# Patient Record
Sex: Male | Born: 2000 | Race: White | Hispanic: No | Marital: Single | Smoking: Current every day smoker
Health system: Southern US, Community
[De-identification: ages and names within clinical notes are randomized; demographics above are authoritative.]

## PROBLEM LIST (undated history)

## (undated) DIAGNOSIS — F1110 Opioid abuse, uncomplicated: Secondary | ICD-10-CM

## (undated) DIAGNOSIS — F909 Attention-deficit hyperactivity disorder, unspecified type: Secondary | ICD-10-CM

## (undated) DIAGNOSIS — F913 Oppositional defiant disorder: Secondary | ICD-10-CM

---
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
## 2019-05-01 ENCOUNTER — Emergency Department (HOSPITAL_BASED_OUTPATIENT_CLINIC_OR_DEPARTMENT_OTHER)
Admission: EM | Admit: 2019-05-01 | Discharge: 2019-05-01 | Disposition: A | Discharge status: Home / Self Care | Payer: BLUE CROSS/BLUE SHIELD | Attending: Emergency Medicine | Admitting: Emergency Medicine

## 2019-05-01 ENCOUNTER — Encounter (HOSPITAL_BASED_OUTPATIENT_CLINIC_OR_DEPARTMENT_OTHER): Payer: Self-pay | Admitting: Emergency Medicine

## 2019-05-01 ENCOUNTER — Other Ambulatory Visit: Payer: Self-pay

## 2019-05-01 DIAGNOSIS — F17210 Nicotine dependence, cigarettes, uncomplicated: Secondary | ICD-10-CM | POA: Diagnosis not present

## 2019-05-01 DIAGNOSIS — M79621 Pain in right upper arm: Secondary | ICD-10-CM | POA: Insufficient documentation

## 2019-05-01 DIAGNOSIS — M79622 Pain in left upper arm: Secondary | ICD-10-CM | POA: Insufficient documentation

## 2019-05-01 DIAGNOSIS — Z880 Allergy status to penicillin: Secondary | ICD-10-CM | POA: Insufficient documentation

## 2019-05-01 DIAGNOSIS — R519 Headache, unspecified: Secondary | ICD-10-CM | POA: Diagnosis not present

## 2019-05-01 DIAGNOSIS — M25562 Pain in left knee: Secondary | ICD-10-CM | POA: Diagnosis not present

## 2019-05-01 DIAGNOSIS — Z79899 Other long term (current) drug therapy: Secondary | ICD-10-CM | POA: Insufficient documentation

## 2019-05-01 HISTORY — DX: Opioid abuse, uncomplicated: F11.10

## 2019-05-01 MED ORDER — CYCLOBENZAPRINE HCL 10 MG PO TABS: 10.0000 mg | ORAL_TABLET | Freq: Two times a day (BID) | ORAL | 0 refills | Status: AC | PRN

## 2019-05-01 MED ORDER — NAPROXEN 500 MG PO TABS: 500.0000 mg | ORAL_TABLET | Freq: Two times a day (BID) | ORAL | 0 refills | Status: AC
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
## 2019-11-04 ENCOUNTER — Encounter (HOSPITAL_BASED_OUTPATIENT_CLINIC_OR_DEPARTMENT_OTHER): Payer: Self-pay | Admitting: Emergency Medicine

## 2019-11-04 ENCOUNTER — Emergency Department (HOSPITAL_BASED_OUTPATIENT_CLINIC_OR_DEPARTMENT_OTHER): Payer: BC Managed Care – PPO

## 2019-11-04 ENCOUNTER — Other Ambulatory Visit: Payer: Self-pay

## 2019-11-04 ENCOUNTER — Emergency Department (HOSPITAL_BASED_OUTPATIENT_CLINIC_OR_DEPARTMENT_OTHER)
Admission: EM | Admit: 2019-11-04 | Discharge: 2019-11-04 | Disposition: A | Discharge status: Home / Self Care | Payer: BC Managed Care – PPO | Attending: Emergency Medicine | Admitting: Emergency Medicine

## 2019-11-04 DIAGNOSIS — S6292XA Unspecified fracture of left wrist and hand, initial encounter for closed fracture: Secondary | ICD-10-CM | POA: Insufficient documentation

## 2019-11-04 DIAGNOSIS — Y9351 Activity, roller skating (inline) and skateboarding: Secondary | ICD-10-CM | POA: Insufficient documentation

## 2019-11-04 DIAGNOSIS — F909 Attention-deficit hyperactivity disorder, unspecified type: Secondary | ICD-10-CM | POA: Insufficient documentation

## 2019-11-04 DIAGNOSIS — Y9289 Other specified places as the place of occurrence of the external cause: Secondary | ICD-10-CM | POA: Diagnosis not present

## 2019-11-04 DIAGNOSIS — F17210 Nicotine dependence, cigarettes, uncomplicated: Secondary | ICD-10-CM | POA: Insufficient documentation

## 2019-11-04 DIAGNOSIS — Y999 Unspecified external cause status: Secondary | ICD-10-CM | POA: Insufficient documentation

## 2019-11-04 DIAGNOSIS — S62102A Fracture of unspecified carpal bone, left wrist, initial encounter for closed fracture: Secondary | ICD-10-CM

## 2019-11-04 DIAGNOSIS — S6992XA Unspecified injury of left wrist, hand and finger(s), initial encounter: Secondary | ICD-10-CM | POA: Diagnosis present

## 2019-11-04 MED ORDER — IBUPROFEN 400 MG PO TABS
600.0000 mg | ORAL_TABLET | Freq: Once | ORAL | Status: AC
  Administered 2019-11-04: 600 mg via ORAL
  Filled 2019-11-04: qty 1
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
## 2019-11-04 NOTE — ED Provider Notes (Signed)
[facility] EMERGENCY DEPARTMENT Provider Note   CSN: [id number] Arrival date & time: 11/04/19  1943     History Chief Complaint  Patient presents with  . Wrist Pain   Lee Trevino is a 19 year old man with past medical history of ADHD and prior opioid abuse who presents for evaluation of left wrist pain.   Patient reports consuming multiple beers, shots of liquor, and margaritas earlier this afternoon. Tonight, while riding a skateboard, he fell onto his outstretched left hand. Immediately after, he noticed swelling to the area and significant pain. He reports that he can still move his hand, and he reports having intact sensation. He has not attempted to take anything for pain, and he states that movement makes the pain worse. He experienced no other injuries and did not hit his head or lose consciousness. Of note, he has a prior history of opioid abuse and wishes to not take narcotic medications for pain.      Past Medical History:  Diagnosis Date  . Attention deficit hyperactivity disorder (ADHD)   . Drug abuse, opioid type ([facility])    " Clean x 1 year"  . ODD (oppositional defiant disorder)    Patient Active Problem List   Diagnosis Date Noted  . Chronic post-traumatic stress disorder (PTSD) 04/05/2015  . ODD (oppositional defiant disorder) 04/03/2015  . DMDD (disruptive mood dysregulation disorder) ([facility]) 04/03/2015  . Marijuana abuse, continuous 04/03/2015  . H/O attention deficit hyperactivity disorder 04/03/2015  . Acne 01/03/2015  . ADD (attention deficit disorder) 06/24/2010  . Cannot sleep 06/24/2010   History reviewed. No pertinent surgical history.    Family History  Problem Relation Age of Onset  . Bipolar disorder Mother    Social History   Tobacco Use  . Smoking status: Current Every Day Smoker    Packs/day: 0.50    Years: 4.00    Pack years: 2.00    Types: Cigarettes  . Smokeless tobacco: Former Engineer, water Use Topics  . Alcohol  use: No  . Drug use: Yes    Types: Marijuana    Comment: Daiy use   Home Medications Prior to Admission medications   Medication Sig Start Date End Date Taking? Authorizing Provider  cyclobenzaprine (FLEXERIL) 10 MG tablet Take 1 tablet (10 mg total) by mouth 2 (two) times daily as needed for muscle spasms. 05/01/19   Mannie Stabile, PA-C  escitalopram (LEXAPRO) 10 MG tablet Reported on 05/10/2015 02/19/15   [provider]  hydrOXYzine (ATARAX/VISTARIL) 10 MG tablet Take 1-2 tablets as needed for anxiety 04/05/15   Court Joy, PA-C  ibuprofen (ADVIL,MOTRIN) 600 MG tablet Take 1 tablet (600 mg total) by mouth every 6 (six) hours as needed. 12/24/14   Antony Madura, PA-C  lamoTRIgine (LAMICTAL) 25 MG tablet Take 25 mg x 5 days then 50 mg x 5 days then 75 mg x 5 days then 100 mg daily as directed 04/11/15   Court Joy, PA-C  lisdexamfetamine (VYVANSE) 30 MG capsule Take 75 mg by mouth daily.     [provider]  lisdexamfetamine (VYVANSE) 30 MG capsule Take by mouth. 04/15/15   [provider]  Melatonin 10 MG CAPS Take by mouth. Reported on 05/10/2015    [provider]  naproxen (NAPROSYN) 500 MG tablet Take 1 tablet (500 mg total) by mouth 2 (two) times daily. 05/01/19   Mannie Stabile, PA-C   Allergies    Amoxicillin and Penicillins  Review of  Systems   Review of Systems  Constitutional: Negative for chills and fever.  HENT: Negative for ear pain and sore throat.   Eyes: Negative for pain and visual disturbance.  Respiratory: Negative for cough and shortness of breath.   Cardiovascular: Negative for chest pain and palpitations.  Gastrointestinal: Negative for abdominal pain and vomiting.  Genitourinary: Negative for dysuria and hematuria.  Musculoskeletal:       Significant pain and swelling of the left wrist.  Skin: Negative for color change and rash.  Neurological: Negative for seizures and syncope.  All other systems reviewed and  are negative.  Physical Exam Updated Vital Signs BP (!) 119/95 (BP Location: Right Arm)   Pulse (!) 123   Temp 98.4 F (36.9 C) (Oral)   Resp 18   Ht 5\' 11"  (1.803 m)   Wt 126.1 kg   SpO2 98%   BMI 38.77 kg/m   Physical Exam Vitals and nursing note reviewed.  Constitutional:      Appearance: He is well-developed.  HENT:     Head: Normocephalic and atraumatic.  Eyes:     Conjunctiva/sclera: Conjunctivae normal.  Cardiovascular:     Rate and Rhythm: Normal rate and regular rhythm.     Pulses: Normal pulses.     Heart sounds: No murmur heard.      Comments: Intact left radial pulse Pulmonary:     Effort: Pulmonary effort is normal. No respiratory distress.     Breath sounds: Normal breath sounds.  Abdominal:     Palpations: Abdomen is soft.     Tenderness: There is no abdominal tenderness.  Musculoskeletal:        General: Swelling, tenderness, deformity and signs of injury present.     Cervical back: Neck supple.     Comments: Significant swelling and tenderness to the dorsal aspect of the left wrist. ROM limited due to pain.  Skin:    General: Skin is warm and dry.     Comments: No lacerations or bruising to the left wrist  Neurological:     General: No focal deficit present.     Mental Status: He is alert and oriented to person, place, and time.     Comments: Sensation of left digits intact  Psychiatric:        Mood and Affect: Mood normal.        Behavior: Behavior normal.    ED Results / Procedures / Treatments   Labs (all labs ordered are listed, but only abnormal results are displayed) Labs Reviewed - No data to display  EKG None  Radiology DG Wrist Complete Left  Result Date: 11/04/2019 CLINICAL DATA:  Injury EXAM: LEFT WRIST - COMPLETE 3+ VIEW COMPARISON:  None. FINDINGS: There is a nondisplaced buckle fracture seen through the distal radius. Intra-articular extension is seen along the dorsal ulnar surface of the distal radius. There also appears to  be a probable nondisplaced fracture seen through the mid scaphoid waist. Diffuse soft tissue swelling seen around the wrist. IMPRESSION: Nondisplaced buckle fracture of the distal radius with intra-articular extension dorsally. Nondisplaced scaphoid waist fracture Electronically Signed   By: 01/04/2020 M.D.   On: 11/04/2019 20:20    Procedures Procedures (including critical care time)  Medications Ordered in ED Medications  ibuprofen (ADVIL) tablet 600 mg (has no administration in time range)    ED Course  I have reviewed the triage vital signs and the nursing notes.  Pertinent labs & imaging results that were available during my  care of the patient were reviewed by me and considered in my medical decision making (see chart for details).    MDM Rules/Calculators/A&P                          Lee Trevino is a 19 year old man with past medical history of ADHD and prior opioid abuse who presents for evaluation of left wrist pain. Patient is hemodynamically stable aside from tachycardia which is likely secondary to pain. Physical examination reveals significant swelling and tenderness to the dorsal aspect of the left wrist. Radiologic findings demonstrate nondisplaced buckle fracture of the distal radius and nondisplaced scaphoid waist fracture. Given these findings, he will require immobilization of the wrist with a thumb spica splint as well as follow-up with Dr. Melvyn Novas. We will give him ibuprofen while in the ED, as we are limited with pain management options in the setting of acute intoxication and history of opioid abuse. Additionally, we will put in a work note indicating that he will not be able to return to work until cleared by specialist.  Final Clinical Impression(s) / ED Diagnoses Final diagnoses:  Closed fracture of left wrist, initial encounter    Rx / DC Orders ED Discharge Orders    None       Jasmine December, MD 11/04/19 2146    Gwyneth Sprout,  MD 11/04/19 2342

## 2019-11-04 NOTE — ED Notes (Signed)
  Patient states he has had several 16 oz beers, 3-4 mixed drinks, and several shots around 1830.    Patient also states that he is a recovering prescription painkiller addict and would not like any narcotics for pain control.  MD notified.

## 2021-10-18 IMAGING — CR DG WRIST COMPLETE 3+V*L*
4 series · 4 of 4 positions shown · non-contrast
Comparison: None.

CLINICAL DATA: Injury

EXAM:
LEFT WRIST - COMPLETE 3+ VIEW

[x wrist pa left]
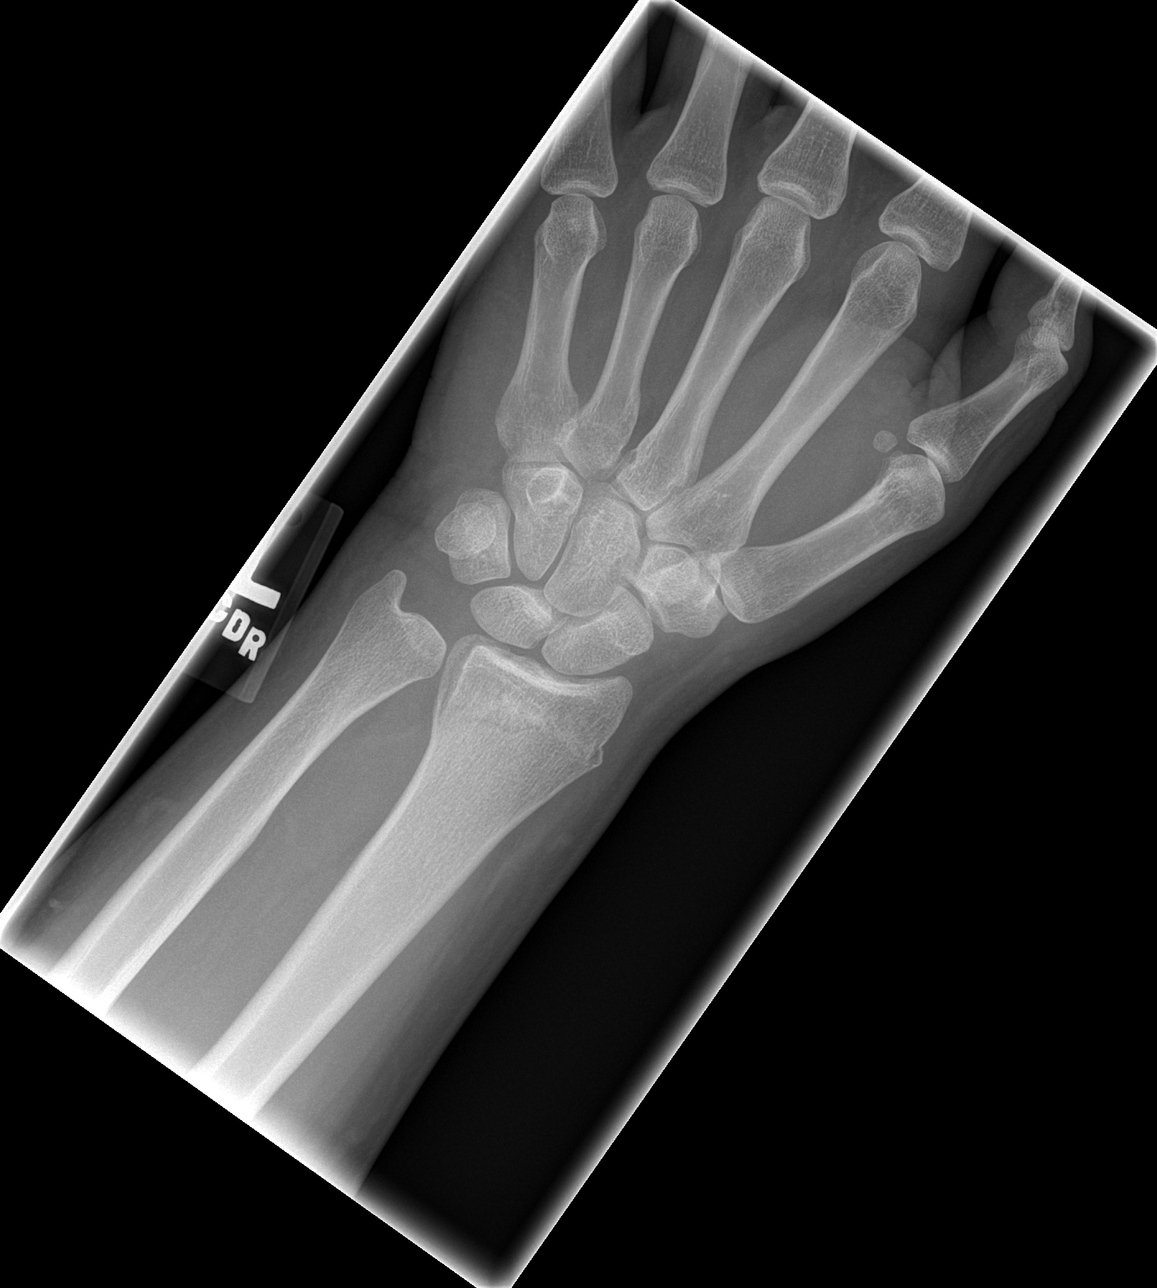

[x navicular]
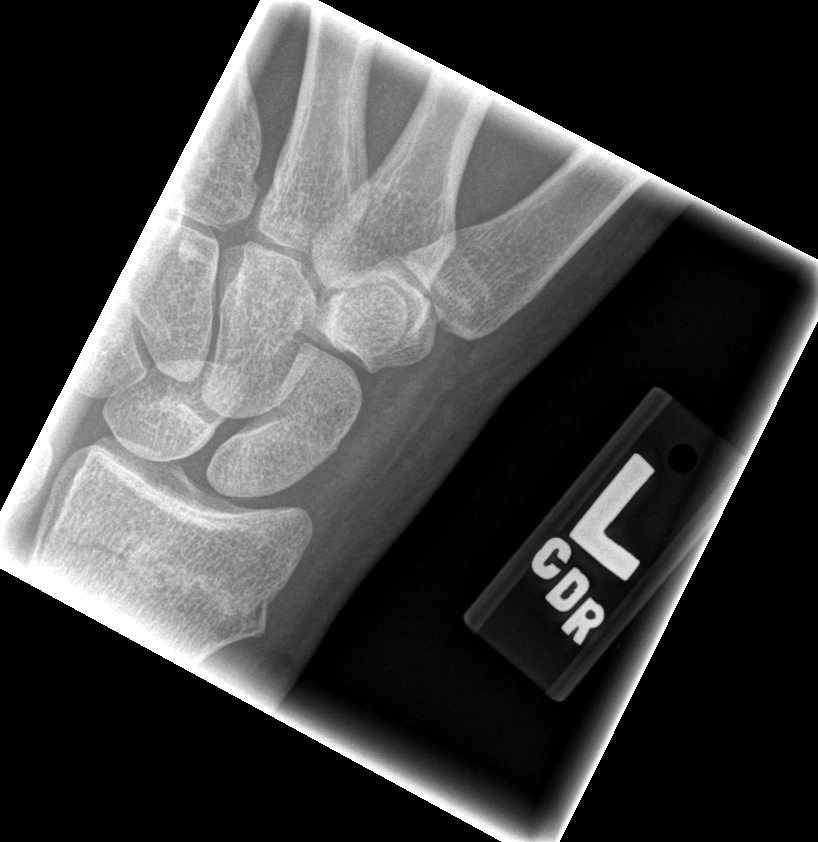

[x wrist obl left]
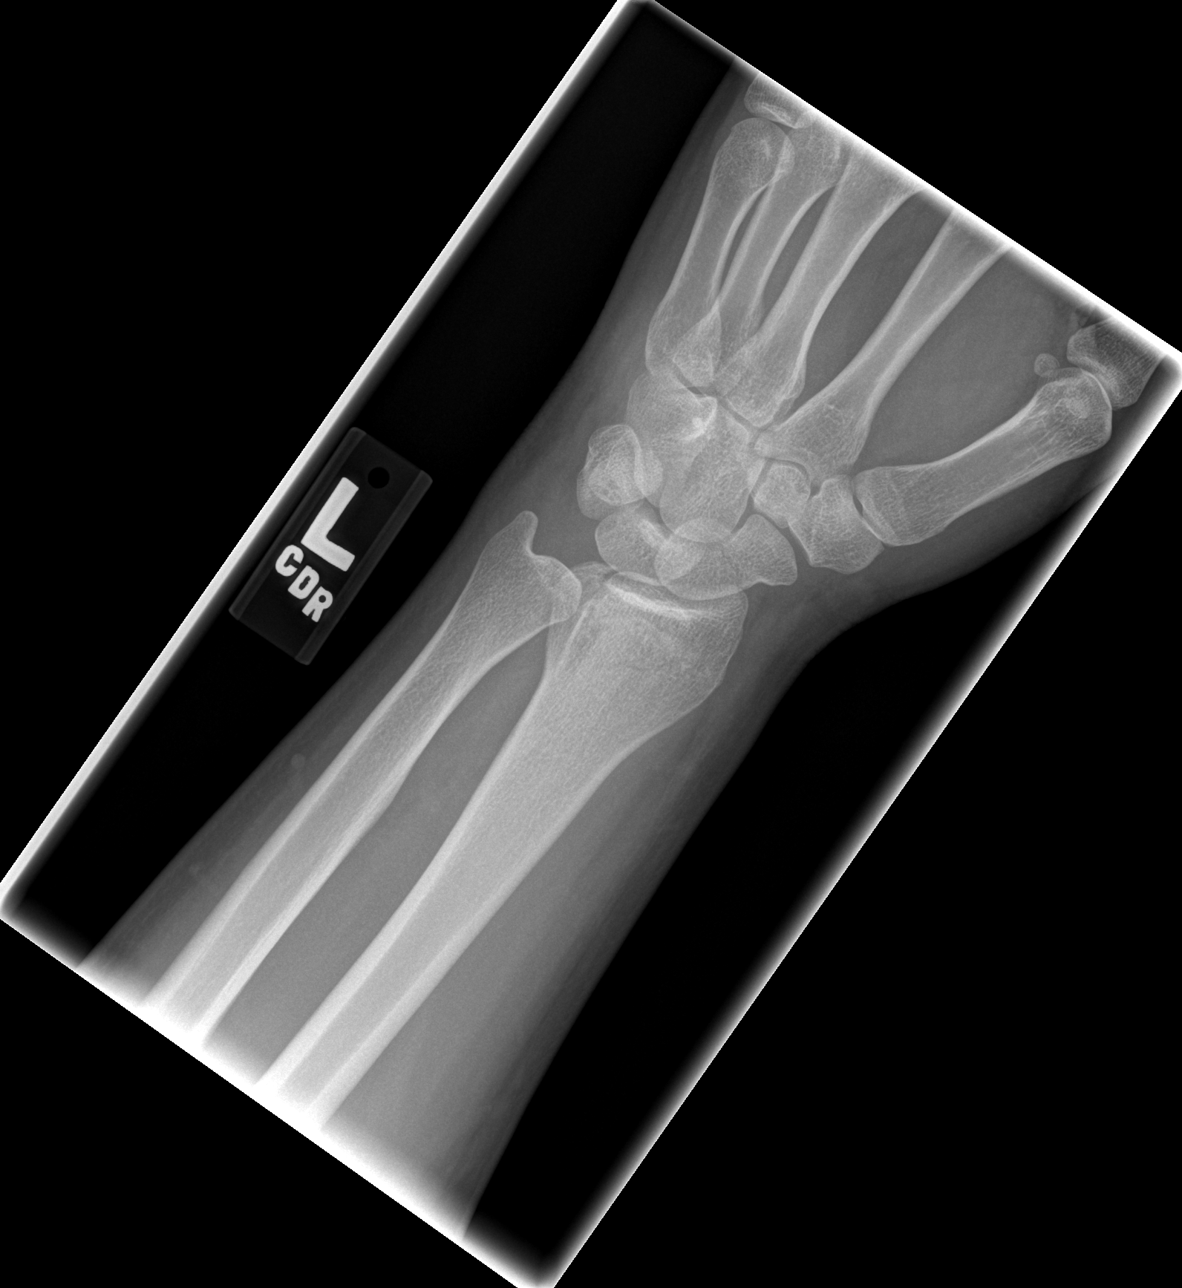

[x wrist lat left]
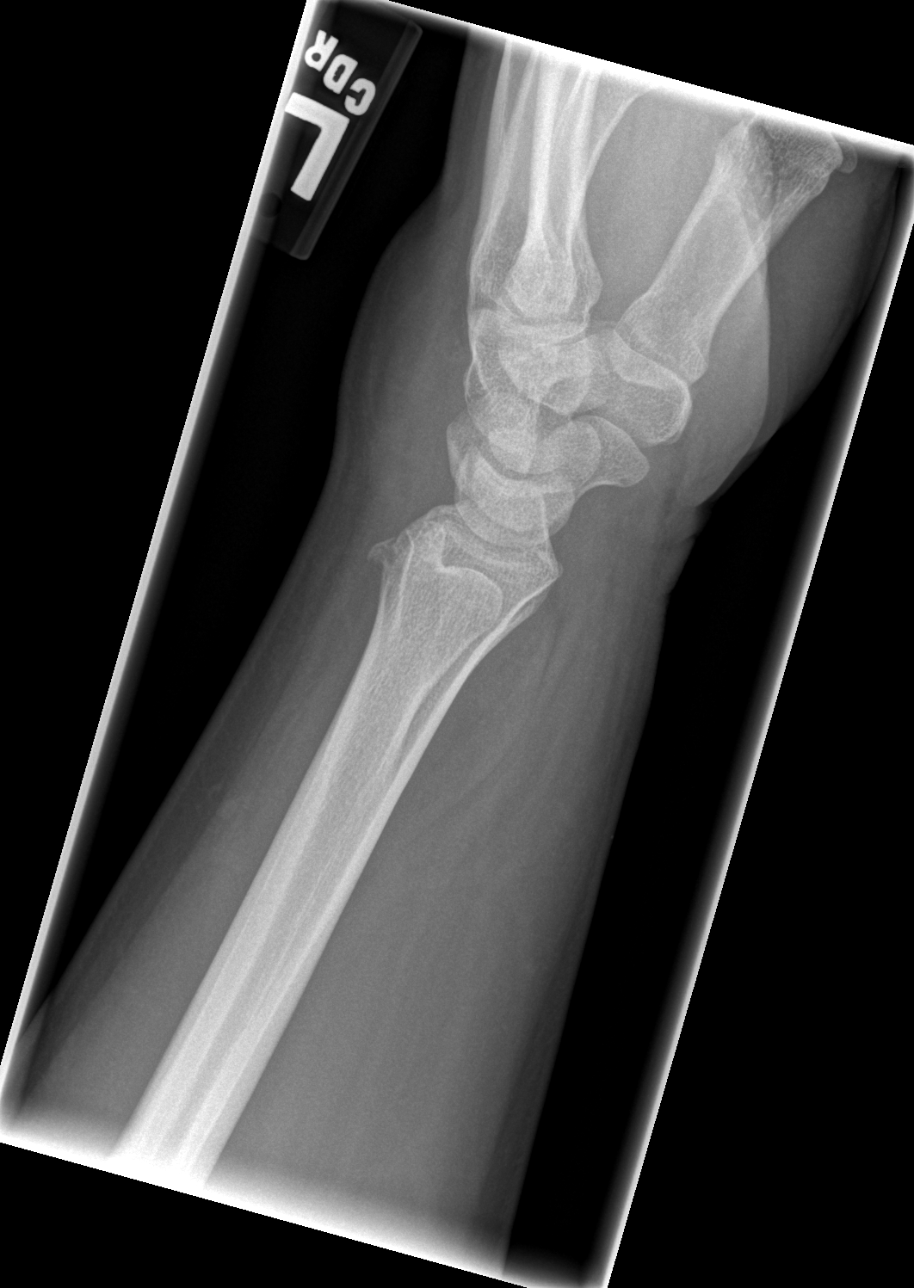

[4 of 4 positions shown; findings below may reference images not displayed]

FINDINGS: There is a nondisplaced buckle fracture seen through the distal
radius. Intra-articular extension is seen along the dorsal ulnar
surface of the distal radius. There also appears to be a probable
nondisplaced fracture seen through the mid scaphoid waist. Diffuse
soft tissue swelling seen around the wrist.
IMPRESSION: Nondisplaced buckle fracture of the distal radius with
intra-articular extension dorsally.

Nondisplaced scaphoid waist fracture
